# Patient Record
Sex: Male | Born: 1990 | Hispanic: Yes | Marital: Single | Smoking: Current every day smoker
Health system: Southern US, Community
[De-identification: ages and names within clinical notes are randomized; demographics above are authoritative.]

## PROBLEM LIST (undated history)

## (undated) HISTORY — PX: KNEE SURGERY: SHX244

---

## 2018-04-03 ENCOUNTER — Emergency Department (HOSPITAL_COMMUNITY): Payer: Self-pay

## 2018-04-03 ENCOUNTER — Emergency Department (HOSPITAL_COMMUNITY)
Admission: EM | Admit: 2018-04-03 | Discharge: 2018-04-03 | Disposition: A | Discharge status: Home / Self Care | Payer: Self-pay | Attending: Emergency Medicine | Admitting: Emergency Medicine

## 2018-04-03 ENCOUNTER — Encounter (HOSPITAL_COMMUNITY): Payer: Self-pay | Admitting: Emergency Medicine

## 2018-04-03 ENCOUNTER — Other Ambulatory Visit: Payer: Self-pay

## 2018-04-03 DIAGNOSIS — Y9389 Activity, other specified: Secondary | ICD-10-CM | POA: Insufficient documentation

## 2018-04-03 DIAGNOSIS — T148XXA Other injury of unspecified body region, initial encounter: Secondary | ICD-10-CM | POA: Insufficient documentation

## 2018-04-03 DIAGNOSIS — Y998 Other external cause status: Secondary | ICD-10-CM | POA: Insufficient documentation

## 2018-04-03 DIAGNOSIS — M5441 Lumbago with sciatica, right side: Secondary | ICD-10-CM | POA: Insufficient documentation

## 2018-04-03 DIAGNOSIS — F17210 Nicotine dependence, cigarettes, uncomplicated: Secondary | ICD-10-CM | POA: Insufficient documentation

## 2018-04-03 DIAGNOSIS — Y929 Unspecified place or not applicable: Secondary | ICD-10-CM | POA: Insufficient documentation

## 2018-04-03 DIAGNOSIS — W19XXXA Unspecified fall, initial encounter: Secondary | ICD-10-CM | POA: Insufficient documentation

## 2018-04-03 DIAGNOSIS — M5442 Lumbago with sciatica, left side: Secondary | ICD-10-CM | POA: Insufficient documentation

## 2018-04-03 MED ORDER — METHOCARBAMOL 500 MG PO TABS: 500.0000 mg | ORAL_TABLET | Freq: Every evening | ORAL | 0 refills | Status: AC | PRN

## 2018-04-03 MED ORDER — PREDNISONE 10 MG (21) PO TBPK: ORAL_TABLET | Freq: Every day | ORAL | 0 refills | Status: AC

## 2018-04-03 MED ORDER — ACETAMINOPHEN 500 MG PO TABS: 1000.0000 mg | ORAL_TABLET | Freq: Three times a day (TID) | ORAL | 0 refills | Status: AC | PRN

## 2018-04-03 MED ORDER — KETOROLAC TROMETHAMINE 15 MG/ML IJ SOLN
15.0000 mg | Freq: Once | INTRAMUSCULAR | Status: DC
  Filled 2018-04-03: qty 1

## 2018-04-03 MED ORDER — IBUPROFEN 800 MG PO TABS
800.0000 mg | ORAL_TABLET | Freq: Once | ORAL | Status: AC
  Administered 2018-04-03: 800 mg via ORAL
  Filled 2018-04-03: qty 1

## 2018-04-03 NOTE — ED Notes (Signed)
Pt transported to xray 

## 2018-04-03 NOTE — ED Triage Notes (Signed)
Patient presents to ED for assessment of right hip and back pain starting yesterday when he woke up.  Denies known injury.  Patient states severe increase with pain with movement.  Denies blood or changes in urination.

## 2018-04-03 NOTE — Discharge Instructions (Signed)
[place]oma prednisone. No toma antiinflammatories como ibuprofen, motrin, o aleve. Si puede toma tylenol por dolor.  [place] robaxin para relajar los musculos.  [place] cremas por los musculos (salonpas, icy hot, o bengay).  [place] calor/caliente, no hielo.  Regresa a la sala de emergencia si tiene fiebre, problemas con la urina/poo, o cone nuevas sintomas  Take Prednisone as prescribed. You may supplement with Tylenol if you need further pain control. Use Robaxin as needed for muscle stiffness or soreness. Have caution, as this may make you tired or groggy. Do not drive or operate heavy machinery while taking this medication.  Use muscle creams (bengay, icy hot, salonpas) as needed for pain.  Use heat for pain. Return to the ER if you develop high fevers, numbness, loss of bowel or bladder control, or any new or concerning symptoms.

## 2018-04-03 NOTE — ED Provider Notes (Signed)
[facility] EMERGENCY DEPARTMENT Provider Note   CSN: [id number] Arrival date & time: 04/03/18  1227     History   Chief Complaint Chief Complaint  Patient presents with  . Back Pain    HPI Edwin Mercado is a 27 y.o. male presenting for evaluation of back pain.  Initially, patient states that he developed back pain around 9:00 yesterday morning.  It gradually worsened, to the point where he is having difficulty walking due to his pain.  Pain is mostly in the midline, and radiates to both legs.  He denies fall, trauma, or injury.  He denies loss of bowel or bladder control.  It is equal on both sides.  He has taken Tylenol without improvement of pain, has not taken anything else.  He works in [employer].  Reports intermittent back pain, but never like this.  He has no medical problems, takes no medications daily.  He denies rash, numbness, tingling, history of cancer, or history of IV drug use.  At the end of the interview, patient reports that he had a fall from a low height yesterday.  Cannot explain how he fell, but landed on his right side.  He is wondering if this is what is causing his pain.  He states that he had no pain after the fall, landed only on his hip.  He denies hitting his head or loss of consciousness.  He denies chest pain, shortness of breath, nausea, vomiting, urinary sxs abdominal pain.  HPI  History reviewed. No pertinent past medical history.  There are no active problems to display for this patient.   Past Surgical History:  Procedure Laterality Date  . KNEE SURGERY          Home Medications    Prior to Admission medications   Medication Sig Start Date End Date Taking? Authorizing Provider  acetaminophen (TYLENOL) 500 MG tablet Take 2 tablets (1,000 mg total) by mouth every 8 (eight) hours as needed. 04/03/18   Adorian Gwynne, PA-C  methocarbamol (ROBAXIN) 500 MG tablet Take 1 tablet (500 mg total) by mouth at bedtime as needed for  muscle spasms. 04/03/18   Jovian Lembcke, PA-C  predniSONE (STERAPRED UNI-PAK 21 TAB) 10 MG (21) TBPK tablet Take by mouth daily. Take 6 tabs by mouth daily  for 2 days, then 5 tabs for 2 days, then 4 tabs for 2 days, then 3 tabs for 2 days, 2 tabs for 2 days, then 1 tab by mouth daily for 2 days 04/03/18   Halleigh Comes, PA-C    Family History History reviewed. No pertinent family history.  Social History Social History   Tobacco Use  . Smoking status: Current Every Day Smoker    Packs/day: 0.25  . Smokeless tobacco: Never Used  Substance Use Topics  . Alcohol use: Not Currently  . Drug use: Never     Allergies   Patient has no allergy information on record.   Review of Systems Review of Systems  Constitutional: Negative for fever.  HENT: Negative for nosebleeds.   Respiratory: Negative for cough and shortness of breath.   Cardiovascular: Negative for chest pain.  Gastrointestinal: Negative for abdominal pain.  Musculoskeletal: Positive for back pain.  Skin: Negative for wound.  Neurological: Negative for numbness and headaches.  Hematological: Does not bruise/bleed easily.  Psychiatric/Behavioral: Negative for confusion.     Physical Exam Updated Vital Signs BP 116/65 (BP Location: Right Arm)   Pulse (!) 58   Temp 98.5 F (36.9  C) (Oral)   Resp 18   SpO2 100%   Physical Exam  Constitutional: He is oriented to person, place, and time. He appears well-developed and well-nourished. No distress.  HENT:  Head: Normocephalic and atraumatic.  Right Ear: Tympanic membrane, external ear and ear canal normal.  Left Ear: Tympanic membrane, external ear and ear canal normal.  Nose: Nose normal.  Mouth/Throat: Uvula is midline, oropharynx is clear and moist and mucous membranes are normal.   No obvious laceration, hematoma or injury.   Eyes: Pupils are equal, round, and reactive to light. EOM are normal.  EOMI and PERRLA.  Neck: Normal range of motion. Neck  supple.  Full ROM of head and neck without pain. No TTP of midline c-spine   Cardiovascular: Normal rate, regular rhythm and intact distal pulses.  Pulmonary/Chest: Effort normal and breath sounds normal. He exhibits no tenderness.  Abdominal: Soft. He exhibits no distension. There is no tenderness.  Musculoskeletal: Normal range of motion. He exhibits tenderness. He exhibits no deformity.  Tenderness to palpation of midline lumbar back with tenderness of paraspinal muscles.  No TTP elsewhere on the back. No step offs or deformities. No TTP of lateal hips. Patient reports increased pain with extension of the knees.  Pedal pulses intact bilaterally.  Patellar reflexes intact.  Patient is Press photographeramatory.  Neurological: He is alert and oriented to person, place, and time. He has normal strength. No cranial nerve deficit or sensory deficit. GCS eye subscore is 4. GCS verbal subscore is 5. GCS motor subscore is 6.  Fine movement and coordination intact  Skin: Skin is warm. Capillary refill takes less than 2 seconds.  Psychiatric: He has a normal mood and affect.  Nursing note and vitals reviewed.    ED Treatments / Results  Labs (all labs ordered are listed, but only abnormal results are displayed) Labs Reviewed - No data to display  EKG None  Radiology Dg Lumbar Spine Complete  Result Date: 04/03/2018 CLINICAL DATA:  Acute low back pain after fall yesterday. EXAM: LUMBAR SPINE - COMPLETE 4+ VIEW COMPARISON:  None. FINDINGS: There is no evidence of lumbar spine fracture. Alignment is normal. Intervertebral disc spaces are maintained. IMPRESSION: Normal lumbar spine. Electronically Signed   By: Lupita RaiderJames  Green Jr, M.D.   On: 04/03/2018 14:42   Dg Hips Bilat W Or Wo Pelvis 3-4 Views  Result Date: 04/03/2018 CLINICAL DATA:  Acute low back and right hip pain after fall yesterday. EXAM: DG HIP (WITH OR WITHOUT PELVIS) 3-4V BILAT COMPARISON:  None. FINDINGS: There is no evidence of hip fracture or  dislocation. There is no evidence of arthropathy or other focal bone abnormality. IMPRESSION: Normal bilateral hips. Electronically Signed   By: Lupita RaiderJames  Green Jr, M.D.   On: 04/03/2018 14:40    Procedures Procedures (including critical care time)  Medications Ordered in ED Medications  ketorolac (TORADOL) 15 MG/ML injection 15 mg (15 mg Intramuscular Not Given 04/03/18 1439)  ibuprofen (ADVIL,MOTRIN) tablet 800 mg (800 mg Oral Given 04/03/18 1443)     Initial Impression / Assessment and Plan / ED Course  I have reviewed the triage vital signs and the nursing notes.  Pertinent labs & imaging results that were available during my care of the patient were reviewed by me and considered in my medical decision making (see chart for details).      Patient presenting for evaluation of low back pain.  Physical exam reassuring, neurovascularly intact.  Pt reports a small fall yesterday without pain,  otherwise no red flags for back pain.  Pain is reproducible with palpation of the musculature.  Likely musculoskeletal. Will obtain xrays of hips and lumbar spine.   X-rays viewed and interpreted by me, no fractures or dislocations.  Discussed with patient.  At this time, likely muscular, although as patient has radiation of pain to both legs, concern for possible sciatica.  Will treat with prednisone taper, muscle relaxers, muscle creams, and heat.  Follow-up as needed.  At this time, patient appears safe for discharge.  Return precautions given.  Patient states he understands and agrees to plan.   Final Clinical Impressions(s) / ED Diagnoses   Final diagnoses:  Muscle strain  Acute midline low back pain with bilateral sciatica    ED Discharge Orders        Ordered    predniSONE (STERAPRED UNI-PAK 21 TAB) 10 MG (21) TBPK tablet  Daily     04/03/18 1457    methocarbamol (ROBAXIN) 500 MG tablet  At bedtime PRN     04/03/18 1457    acetaminophen (TYLENOL) 500 MG tablet  Every 8 hours PRN      04/03/18 1500       Drae Mitzel, PA-C 04/03/18 1727    Vanetta Mulders, MD 04/07/18 1839

## 2018-04-03 NOTE — ED Notes (Signed)
Patient able to ambulate independently  

## 2019-09-20 IMAGING — CR DG HIP (WITH OR WITHOUT PELVIS) 3-4V BILAT
5 series · 5 of 5 positions shown · non-contrast
Comparison: None.

CLINICAL DATA: Acute low back and right hip pain after fall
yesterday.

EXAM:
DG HIP (WITH OR WITHOUT PELVIS) 3-4V BILAT

[pelvis ap]
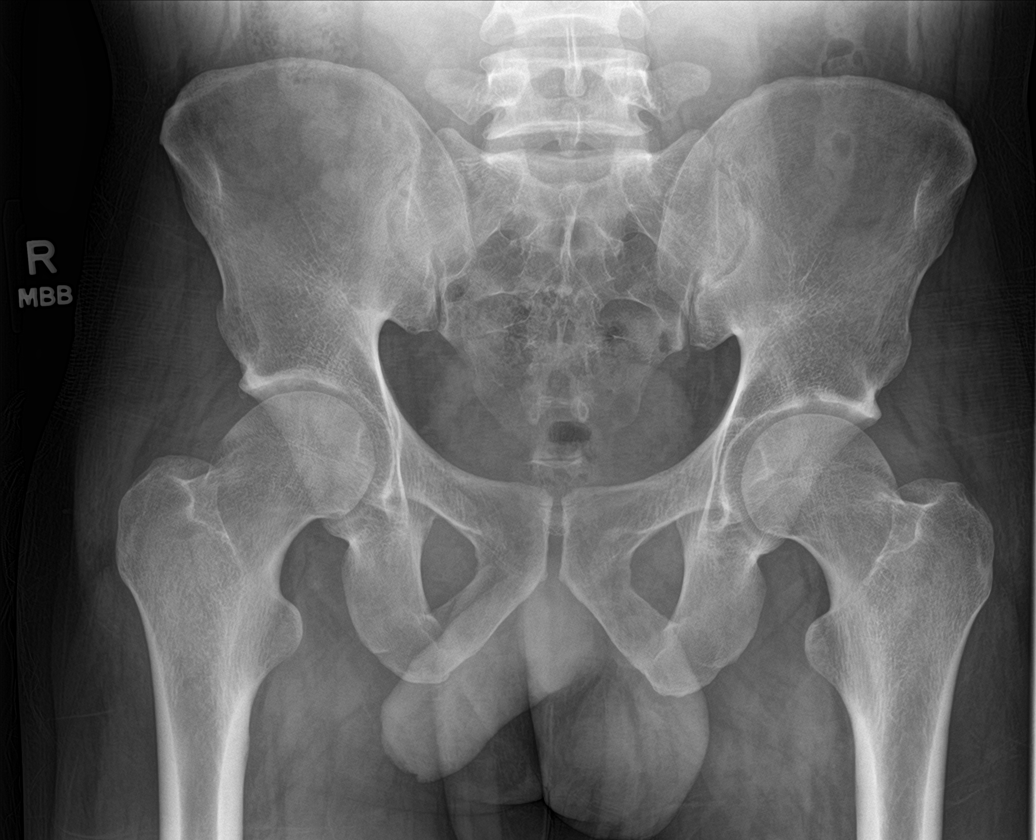

[hip ap (1 of 2)]
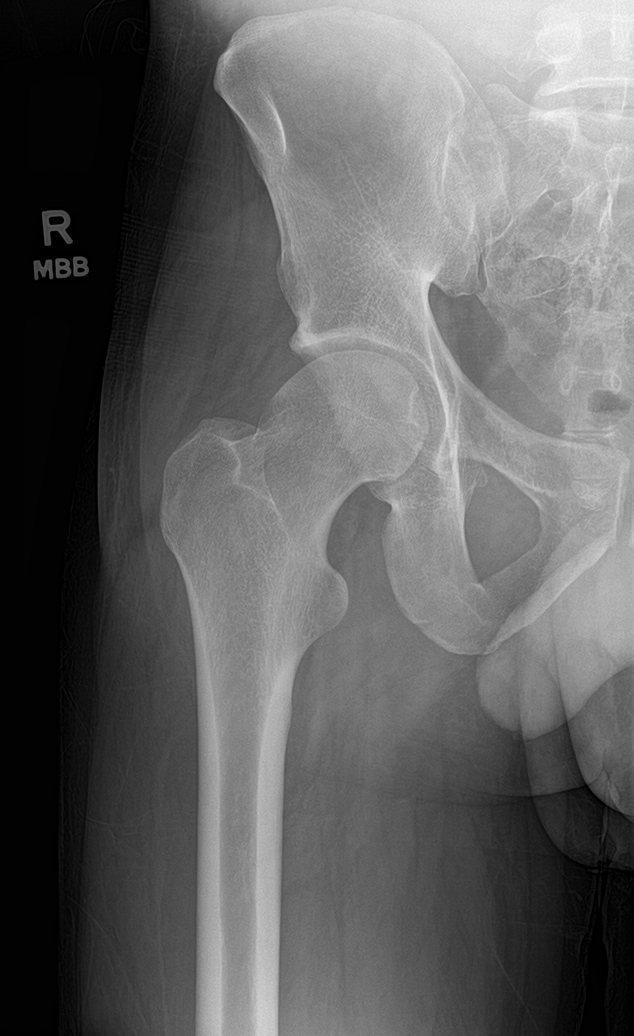

[hip lat (1 of 2)]
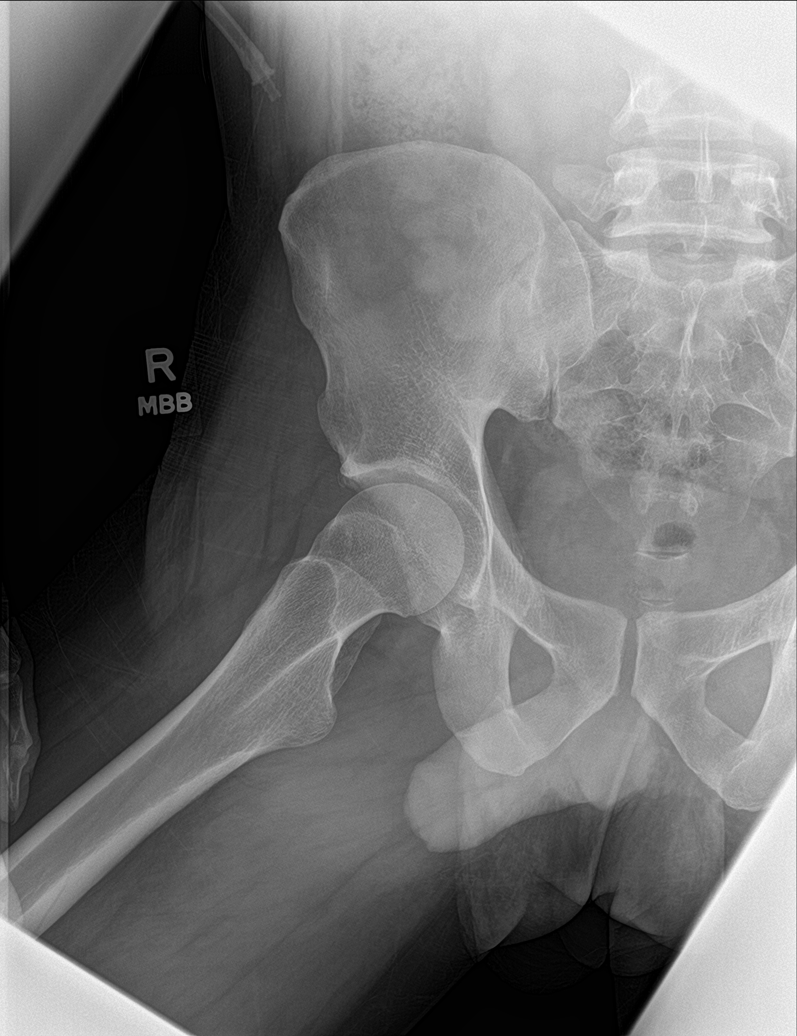

[hip ap (2 of 2)]
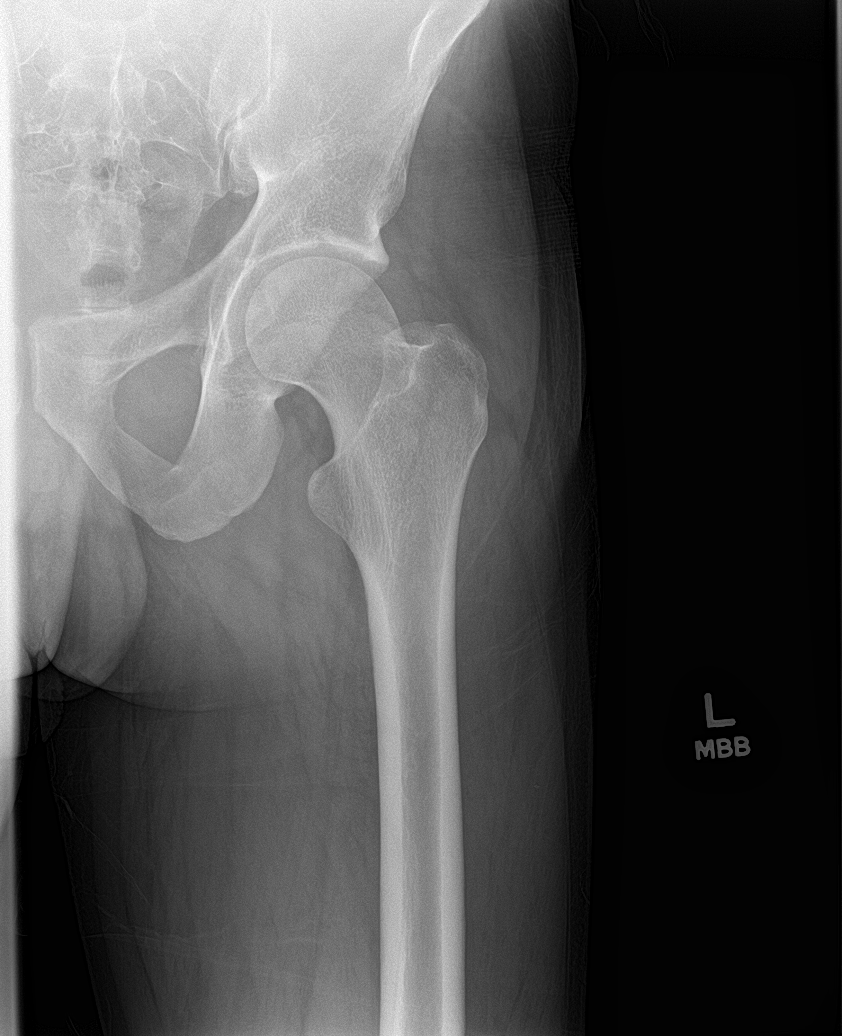

[hip lat (2 of 2)]
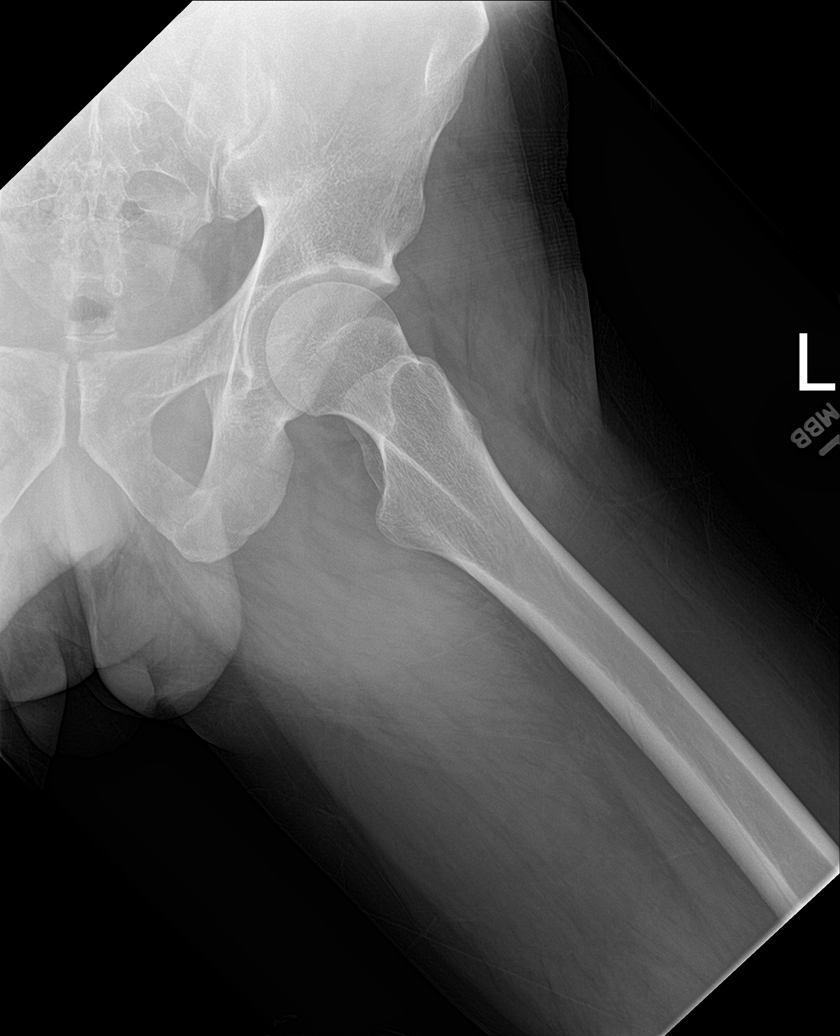

[5 of 5 positions shown; findings below may reference images not displayed]

FINDINGS: There is no evidence of hip fracture or dislocation. There is no
evidence of arthropathy or other focal bone abnormality.
IMPRESSION: Normal bilateral hips.
